# Patient Record
Sex: Female | Born: 1995 | Race: Black or African American | Hispanic: No | Marital: Single | Smoking: Former smoker
Health system: Southern US, Community
[De-identification: ages and names within clinical notes are randomized; demographics above are authoritative.]

## PROBLEM LIST (undated history)

## (undated) HISTORY — PX: TRACHEOSTOMY: SUR1362

---
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
## 2017-03-06 ENCOUNTER — Ambulatory Visit (INDEPENDENT_AMBULATORY_CARE_PROVIDER_SITE_OTHER): Payer: Self-pay

## 2017-03-06 ENCOUNTER — Encounter: Payer: Self-pay | Admitting: General Practice

## 2017-03-06 DIAGNOSIS — Z3201 Encounter for pregnancy test, result positive: Secondary | ICD-10-CM

## 2017-03-06 LAB — POCT PREGNANCY, URINE: PREG TEST UR: POSITIVE — AB

## 2017-03-06 NOTE — Progress Notes (Signed)
Patient presented to office today for a pregnancy test. Test does confirmed she is pregnant. Patient thinks her last period was in March but she is unsure.  Patient has been given a letter of verification to apply for assistance. She was also advised to start prenatal care and start OTC prenatal vitamins. Patient was advised not to take any medications.

## 2017-04-05 ENCOUNTER — Ambulatory Visit (INDEPENDENT_AMBULATORY_CARE_PROVIDER_SITE_OTHER): Payer: Medicaid Other | Admitting: Obstetrics and Gynecology

## 2017-04-05 ENCOUNTER — Encounter: Payer: Self-pay | Admitting: Obstetrics and Gynecology

## 2017-04-05 ENCOUNTER — Other Ambulatory Visit (HOSPITAL_COMMUNITY)
Admission: RE | Admit: 2017-04-05 | Discharge: 2017-04-05 | Disposition: A | Discharge status: Home / Self Care | Payer: Medicaid Other | Source: Ambulatory Visit | Attending: Obstetrics and Gynecology | Admitting: Obstetrics and Gynecology

## 2017-04-05 VITALS — BP 123/82 | HR 93 | Ht 64.0 in | Wt 255.1 lb

## 2017-04-05 DIAGNOSIS — Z3401 Encounter for supervision of normal first pregnancy, first trimester: Secondary | ICD-10-CM

## 2017-04-05 DIAGNOSIS — Z3400 Encounter for supervision of normal first pregnancy, unspecified trimester: Secondary | ICD-10-CM | POA: Diagnosis not present

## 2017-04-05 DIAGNOSIS — Z124 Encounter for screening for malignant neoplasm of cervix: Secondary | ICD-10-CM | POA: Diagnosis not present

## 2017-04-05 DIAGNOSIS — O99211 Obesity complicating pregnancy, first trimester: Secondary | ICD-10-CM | POA: Diagnosis not present

## 2017-04-05 DIAGNOSIS — E669 Obesity, unspecified: Secondary | ICD-10-CM | POA: Diagnosis not present

## 2017-04-05 DIAGNOSIS — Z3687 Encounter for antenatal screening for uncertain dates: Secondary | ICD-10-CM | POA: Diagnosis present

## 2017-04-05 DIAGNOSIS — Z113 Encounter for screening for infections with a predominantly sexual mode of transmission: Secondary | ICD-10-CM | POA: Diagnosis not present

## 2017-04-05 DIAGNOSIS — O99210 Obesity complicating pregnancy, unspecified trimester: Secondary | ICD-10-CM | POA: Insufficient documentation

## 2017-04-05 LAB — POCT URINALYSIS DIP (DEVICE)
Bilirubin Urine: NEGATIVE
GLUCOSE, UA: NEGATIVE mg/dL
HGB URINE DIPSTICK: NEGATIVE
KETONES UR: NEGATIVE mg/dL
Nitrite: NEGATIVE
Protein, ur: NEGATIVE mg/dL
SPECIFIC GRAVITY, URINE: 1.015 (ref 1.005–1.030)
UROBILINOGEN UA: 0.2 mg/dL (ref 0.0–1.0)
pH: 7.5 (ref 5.0–8.0)

## 2017-04-05 MED ORDER — FOLIC ACID 1 MG PO TABS: 1.0000 mg | ORAL_TABLET | Freq: Every day | ORAL | 1 refills | Status: DC

## 2017-04-05 NOTE — Progress Notes (Addendum)
   PRENATAL VISIT NOTE  Subjective:  Audrey Hansen is a 21 y.o. G1P0 at 1920w1d being seen today for ongoing prenatal care.  She is currently monitored for the following issues for this low-risk pregnancy and has Supervision of normal first pregnancy, antepartum on her problem list.  Patient reports nausea.  Contractions: Not present. Vag. Bleeding: None.  Movement: Absent. Denies leaking of fluid.   Patient denies any pertinent medical problems. No history of Hypertension or DM.  No family history of DM or HTN.  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 No abdominal surgery.   The following portions of the patient's history were reviewed and updated as appropriate: allergies, current medications, past family history, past medical history, past social history, past surgical history and problem list. Problem list updated.  Objective:   Vitals:   04/05/17 1301 04/05/17 1302  BP: 123/82   Pulse: 93   Weight: 255 lb 1.6 oz (115.7 kg)   Height:  5\' 4"  (1.626 m)    Fetal Status:     Movement: Absent     General:  Alert, oriented and cooperative. Patient is in no acute distress.  Skin: Skin is warm and dry. No rash noted.   Cardiovascular: Normal heart rate noted, RRR no murmur  Respiratory: Normal respiratory effort, no problems with respiration noted, CTAB  Abdomen: Soft, gravid, appropriate for gestational age. Pain/Pressure: Present     Pelvic:   Normal health vaginal mucosa. No discharge. Cervical os closed  Extremities: Normal range of motion.     Mental Status: Normal mood and affect. Normal behavior. Normal judgment and thought content.   Assessment and Plan:  Pregnancy: G1P0 at 7020w1d  1. Supervision of normal first pregnancy, antepartum -unsure LMP and morbid obesity. US ordered for dates and to verify viability. Did not attempt FHTs today. -Given morbid obesity checking HgBA1c today -prenatal labs -pap collected -GC/CT off of PAP. -patient reporting nausea with  pre-natal - folic acid called to pharmacy. Follow up in 4 wks.  Preterm labor symptoms and general obstetric precautions including but not limited to vaginal bleeding, contractions, leaking of fluid and fetal movement were reviewed in detail with the patient. Please refer to After Visit Summary for other counseling recommendations.  No Follow-up on file.   Ernestina PennaNicholas Schenk, MD

## 2017-04-05 NOTE — Patient Instructions (Signed)
First Trimester of Pregnancy The first trimester of pregnancy is from week 1 until the end of week 13 (months 1 through 3). During this time, your baby will begin to develop inside you. At 6-8 weeks, the eyes and face are formed, and the heartbeat can be seen on ultrasound. At the end of 12 weeks, all the baby's organs are formed. Prenatal care is all the medical care you receive before the birth of your baby. Make sure you get good prenatal care and follow all of your doctor's instructions. Follow these instructions at home: Medicines  Take over-the-counter and prescription medicines only as told by your doctor. Some medicines are safe and some medicines are not safe during pregnancy.  Take a prenatal vitamin that contains at least 600 micrograms (mcg) of folic acid.  If you have trouble pooping (constipation), take medicine that will make your stool soft (stool softener) if your doctor approves. Eating and drinking  Eat regular, healthy meals.  Your doctor will tell you the amount of weight gain that is right for you.  Avoid raw meat and uncooked cheese.  If you feel sick to your stomach (nauseous) or throw up (vomit): ? Eat 4 or 5 small meals a day instead of 3 large meals. ? Try eating a few soda crackers. ? Drink liquids between meals instead of during meals.  To prevent constipation: ? Eat foods that are high in fiber, like fresh fruits and vegetables, whole grains, and beans. ? Drink enough fluids to keep your pee (urine) clear or pale yellow. Activity  Exercise only as told by your doctor. Stop exercising if you have cramps or pain in your lower belly (abdomen) or low back.  Do not exercise if it is too hot, too humid, or if you are in a place of great height (high altitude).  Try to avoid standing for long periods of time. Move your legs often if you must stand in one place for a long time.  Avoid heavy lifting.  Wear low-heeled shoes. Sit and stand up straight.  You  can have sex unless your doctor tells you not to. Relieving pain and discomfort  Wear a good support bra if your breasts are sore.  Take warm water baths (sitz baths) to soothe pain or discomfort caused by hemorrhoids. Use hemorrhoid cream if your doctor says it is okay.  Rest with your legs raised if you have leg cramps or low back pain.  If you have puffy, bulging veins (varicose veins) in your legs: ? Wear support hose or compression stockings as told by your doctor. ? Raise (elevate) your feet for 15 minutes, 3-4 times a day. ? Limit salt in your food. Prenatal care  Schedule your prenatal visits by the twelfth week of pregnancy.  Write down your questions. Take them to your prenatal visits.  Keep all your prenatal visits as told by your doctor. This is important. Safety  Wear your seat belt at all times when driving.  Make a list of emergency phone numbers. The list should include numbers for family, friends, the hospital, and police and fire departments. General instructions  Ask your doctor for a referral to a local prenatal class. Begin classes no later than at the start of month 6 of your pregnancy.  Ask for help if you need counseling or if you need help with nutrition. Your doctor can give you advice or tell you where to go for help.  Do not use hot tubs, steam rooms, or   saunas.  Do not douche or use tampons or scented sanitary pads.  Do not cross your legs for long periods of time.  Avoid all herbs and alcohol. Avoid drugs that are not approved by your doctor.  Do not use any tobacco products, including cigarettes, chewing tobacco, and electronic cigarettes. If you need help quitting, ask your doctor. You may get counseling or other support to help you quit.  Avoid cat litter boxes and soil used by cats. These carry germs that can cause birth defects in the baby and can cause a loss of your baby (miscarriage) or stillbirth.  Visit your dentist. At home, brush  your teeth with a soft toothbrush. Be gentle when you floss. Contact a doctor if:  You are dizzy.  You have mild cramps or pressure in your lower belly.  You have a nagging pain in your belly area.  You continue to feel sick to your stomach, you throw up, or you have watery poop (diarrhea).  You have a bad smelling fluid coming from your vagina.  You have pain when you pee (urinate).  You have increased puffiness (swelling) in your face, hands, legs, or ankles. Get help right away if:  You have a fever.  You are leaking fluid from your vagina.  You have spotting or bleeding from your vagina.  You have very bad belly cramping or pain.  You gain or lose weight rapidly.  You throw up blood. It may look like coffee grounds.  You are around people who have German measles, fifth disease, or chickenpox.  You have a very bad headache.  You have shortness of breath.  You have any kind of trauma, such as from a fall or a car accident. Summary  The first trimester of pregnancy is from week 1 until the end of week 13 (months 1 through 3).  To take care of yourself and your unborn baby, you will need to eat healthy meals, take medicines only if your doctor tells you to do so, and do activities that are safe for you and your baby.  Keep all follow-up visits as told by your doctor. This is important as your doctor will have to ensure that your baby is healthy and growing well. This information is not intended to replace advice given to you by your health care provider. Make sure you discuss any questions you have with your health care provider. Document Released: 04/04/2008 Document Revised: 10/25/2016 Document Reviewed: 10/25/2016 Elsevier Interactive Patient Education  2017 Elsevier Inc.  

## 2017-04-10 LAB — CYTOLOGY - PAP
ADEQUACY: ABSENT
Chlamydia: NEGATIVE
DIAGNOSIS: NEGATIVE
Neisseria Gonorrhea: NEGATIVE

## 2017-04-12 ENCOUNTER — Ambulatory Visit (HOSPITAL_COMMUNITY)
Admission: RE | Admit: 2017-04-12 | Discharge: 2017-04-12 | Disposition: A | Discharge status: Home / Self Care | Payer: Medicaid Other | Source: Ambulatory Visit | Attending: Obstetrics and Gynecology | Admitting: Obstetrics and Gynecology

## 2017-04-12 DIAGNOSIS — Z3A11 11 weeks gestation of pregnancy: Secondary | ICD-10-CM | POA: Diagnosis not present

## 2017-04-12 DIAGNOSIS — Z3687 Encounter for antenatal screening for uncertain dates: Secondary | ICD-10-CM

## 2017-04-12 DIAGNOSIS — Z3400 Encounter for supervision of normal first pregnancy, unspecified trimester: Secondary | ICD-10-CM

## 2017-04-12 LAB — OBSTETRIC PANEL, INCLUDING HIV
Antibody Screen: NEGATIVE
BASOS ABS: 0 10*3/uL (ref 0.0–0.2)
Basos: 0 %
EOS (ABSOLUTE): 0.1 10*3/uL (ref 0.0–0.4)
Eos: 1 %
HEP B S AG: NEGATIVE
HIV Screen 4th Generation wRfx: NONREACTIVE
Hematocrit: 36.3 % (ref 34.0–46.6)
Hemoglobin: 12 g/dL (ref 11.1–15.9)
IMMATURE GRANS (ABS): 0 10*3/uL (ref 0.0–0.1)
IMMATURE GRANULOCYTES: 0 %
LYMPHS: 35 %
Lymphocytes Absolute: 2.1 10*3/uL (ref 0.7–3.1)
MCH: 25.6 pg — ABNORMAL LOW (ref 26.6–33.0)
MCHC: 33.1 g/dL (ref 31.5–35.7)
MCV: 77 fL — AB (ref 79–97)
MONOCYTES: 6 %
Monocytes Absolute: 0.3 10*3/uL (ref 0.1–0.9)
NEUTROS ABS: 3.4 10*3/uL (ref 1.4–7.0)
NEUTROS PCT: 58 %
PLATELETS: 307 10*3/uL (ref 150–379)
RBC: 4.69 x10E6/uL (ref 3.77–5.28)
RDW: 14.8 % (ref 12.3–15.4)
RPR: NONREACTIVE
Rh Factor: POSITIVE
WBC: 5.9 10*3/uL (ref 3.4–10.8)

## 2017-04-12 LAB — HEMOGLOBINOPATHY EVALUATION
Ferritin: 62 ng/mL (ref 15–150)
HGB C: 0 %
HGB F QUANT: 0 % (ref 0.0–2.0)
HGB S: 0 %
HGB SOLUBILITY: NEGATIVE
Hgb A2 Quant: 2.5 % (ref 1.8–3.2)
Hgb A: 97.5 % (ref 96.4–98.8)
Hgb Variant: 0 %

## 2017-04-12 LAB — HEMOGLOBIN A1C
ESTIMATED AVERAGE GLUCOSE: 114 mg/dL
Hgb A1c MFr Bld: 5.6 % (ref 4.8–5.6)

## 2017-04-12 LAB — ALPHA-THALASSEMIA

## 2017-04-13 LAB — URINE CULTURE, OB REFLEX

## 2017-04-13 LAB — CULTURE, OB URINE

## 2017-04-14 ENCOUNTER — Telehealth: Payer: Self-pay | Admitting: *Deleted

## 2017-04-14 ENCOUNTER — Encounter: Payer: Self-pay | Admitting: *Deleted

## 2017-04-14 DIAGNOSIS — O2340 Unspecified infection of urinary tract in pregnancy, unspecified trimester: Secondary | ICD-10-CM

## 2017-04-14 DIAGNOSIS — B951 Streptococcus, group B, as the cause of diseases classified elsewhere: Secondary | ICD-10-CM | POA: Insufficient documentation

## 2017-04-14 MED ORDER — CEPHALEXIN 500 MG PO CAPS: 500.0000 mg | ORAL_CAPSULE | Freq: Three times a day (TID) | ORAL | 0 refills | Status: DC

## 2017-04-14 NOTE — Telephone Encounter (Addendum)
-----   Message from Lorne SkeensNicholas Michael Schenk, MD sent at 04/13/2017  4:14 PM EDT ----- Please call in Keflex 500mg  TID for 7 days for GBS in urine and notify patient. Thanks Joette CatchingNick  Called pt and left message stating that a prescription has been ordered by the doctor and sent to her pharmacy. Please obtain the medication and take according to directions.  She may call the office next week if she has questions.

## 2017-04-17 ENCOUNTER — Other Ambulatory Visit: Payer: Self-pay | Admitting: Obstetrics and Gynecology

## 2017-05-02 ENCOUNTER — Ambulatory Visit (INDEPENDENT_AMBULATORY_CARE_PROVIDER_SITE_OTHER): Payer: Medicaid Other | Admitting: Advanced Practice Midwife

## 2017-05-02 VITALS — BP 123/78 | HR 92 | Wt 247.7 lb

## 2017-05-02 DIAGNOSIS — Z3402 Encounter for supervision of normal first pregnancy, second trimester: Secondary | ICD-10-CM

## 2017-05-02 DIAGNOSIS — O2342 Unspecified infection of urinary tract in pregnancy, second trimester: Secondary | ICD-10-CM

## 2017-05-02 DIAGNOSIS — B951 Streptococcus, group B, as the cause of diseases classified elsewhere: Secondary | ICD-10-CM | POA: Diagnosis not present

## 2017-05-02 DIAGNOSIS — O99212 Obesity complicating pregnancy, second trimester: Secondary | ICD-10-CM

## 2017-05-02 DIAGNOSIS — Z6836 Body mass index (BMI) 36.0-36.9, adult: Secondary | ICD-10-CM

## 2017-05-02 DIAGNOSIS — Z3A19 19 weeks gestation of pregnancy: Secondary | ICD-10-CM

## 2017-05-02 DIAGNOSIS — Z363 Encounter for antenatal screening for malformations: Secondary | ICD-10-CM

## 2017-05-02 DIAGNOSIS — Z3400 Encounter for supervision of normal first pregnancy, unspecified trimester: Secondary | ICD-10-CM

## 2017-05-02 NOTE — Patient Instructions (Addendum)
Second Trimester of Pregnancy The second trimester is from week 14 through week 27 (months 4 through 6). The second trimester is often a time when you feel your best. Your body has adjusted to being pregnant, and you begin to feel better physically. Usually, morning sickness has lessened or quit completely, you may have more energy, and you may have an increase in appetite. The second trimester is also a time when the fetus is growing rapidly. At the end of the sixth month, the fetus is about 9 inches long and weighs about 1 pounds. You will likely begin to feel the baby move (quickening) between 16 and 20 weeks of pregnancy. Body changes during your second trimester Your body continues to go through many changes during your second trimester. The changes vary from woman to woman.  Your weight will continue to increase. You will notice your lower abdomen bulging out.  You may begin to get stretch marks on your hips, abdomen, and breasts.  You may develop headaches that can be relieved by medicines. The medicines should be approved by your health care provider.  You may urinate more often because the fetus is pressing on your bladder.  You may develop or continue to have heartburn as a result of your pregnancy.  You may develop constipation because certain hormones are causing the muscles that push waste through your intestines to slow down.  You may develop hemorrhoids or swollen, bulging veins (varicose veins).  You may have back pain. This is caused by:  Weight gain.  Pregnancy hormones that are relaxing the joints in your pelvis.  A shift in weight and the muscles that support your balance.  Your breasts will continue to grow and they will continue to become tender.  Your gums may bleed and may be sensitive to brushing and flossing.  Dark spots or blotches (chloasma, mask of pregnancy) may develop on your face. This will likely fade after the baby is born.  A dark line from your  belly button to the pubic area (linea nigra) may appear. This will likely fade after the baby is born.  You may have changes in your hair. These can include thickening of your hair, rapid growth, and changes in texture. Some women also have hair loss during or after pregnancy, or hair that feels dry or thin. Your hair will most likely return to normal after your baby is born. What to expect at prenatal visits During a routine prenatal visit:  You will be weighed to make sure you and the fetus are growing normally.  Your blood pressure will be taken.  Your abdomen will be measured to track your baby's growth.  The fetal heartbeat will be listened to.  Any test results from the previous visit will be discussed. Your health care provider may ask you:  How you are feeling.  If you are feeling the baby move.  If you have had any abnormal symptoms, such as leaking fluid, bleeding, severe headaches, or abdominal cramping.  If you are using any tobacco products, including cigarettes, chewing tobacco, and electronic cigarettes.  If you have any questions. Other tests that may be performed during your second trimester include:  Blood tests that check for:  Low iron levels (anemia).  High blood sugar that affects pregnant women (gestational diabetes) between 24 and 28 weeks.  Rh antibodies. This is to check for a protein on red blood cells (Rh factor).  Urine tests to check for infections, diabetes, or protein in the   urine.  An ultrasound to confirm the proper growth and development of the baby.  An amniocentesis to check for possible genetic problems.  Fetal screens for spina bifida and Down syndrome.  HIV (human immunodeficiency virus) testing. Routine prenatal testing includes screening for HIV, unless you choose not to have this test. Follow these instructions at home: Medicines   Follow your health care provider's instructions regarding medicine use. Specific medicines may  be either safe or unsafe to take during pregnancy.  Take a prenatal vitamin that contains at least 600 micrograms (mcg) of folic acid.  If you develop constipation, try taking a stool softener if your health care provider approves. Eating and drinking   Eat a balanced diet that includes fresh fruits and vegetables, whole grains, good sources of protein such as meat, eggs, or tofu, and low-fat dairy. Your health care provider will help you determine the amount of weight gain that is right for you.  Avoid raw meat and uncooked cheese. These carry germs that can cause birth defects in the baby.  If you have low calcium intake from food, talk to your health care provider about whether you should take a daily calcium supplement.  Limit foods that are high in fat and processed sugars, such as fried and sweet foods.  To prevent constipation:  Drink enough fluid to keep your urine clear or pale yellow.  Eat foods that are high in fiber, such as fresh fruits and vegetables, whole grains, and beans. Activity   Exercise only as directed by your health care provider. Most women can continue their usual exercise routine during pregnancy. Try to exercise for 30 minutes at least 5 days a week. Stop exercising if you experience uterine contractions.  Avoid heavy lifting, wear low heel shoes, and practice good posture.  A sexual relationship may be continued unless your health care provider directs you otherwise. Relieving pain and discomfort   Wear a good support bra to prevent discomfort from breast tenderness.  Take warm sitz baths to soothe any pain or discomfort caused by hemorrhoids. Use hemorrhoid cream if your health care provider approves.  Rest with your legs elevated if you have leg cramps or low back pain.  If you develop varicose veins, wear support hose. Elevate your feet for 15 minutes, 3-4 times a day. Limit salt in your diet. Prenatal Care   Write down your questions. Take them  to your prenatal visits.  Keep all your prenatal visits as told by your health care provider. This is important. Safety   Wear your seat belt at all times when driving.  Make a list of emergency phone numbers, including numbers for family, friends, the hospital, and police and fire departments. General instructions   Ask your health care provider for a referral to a local prenatal education class. Begin classes no later than the beginning of month 6 of your pregnancy.  Ask for help if you have counseling or nutritional needs during pregnancy. Your health care provider can offer advice or refer you to specialists for help with various needs.  Do not use hot tubs, steam rooms, or saunas.  Do not douche or use tampons or scented sanitary pads.  Do not cross your legs for long periods of time.  Avoid cat litter boxes and soil used by cats. These carry germs that can cause birth defects in the baby and possibly loss of the fetus by miscarriage or stillbirth.  Avoid all smoking, herbs, alcohol, and unprescribed drugs. Chemicals in   these products can affect the formation and growth of the baby.  Do not use any products that contain nicotine or tobacco, such as cigarettes and e-cigarettes. If you need help quitting, ask your health care provider.  Visit your dentist if you have not gone yet during your pregnancy. Use a soft toothbrush to brush your teeth and be gentle when you floss. Contact a health care provider if:  You have dizziness.  You have mild pelvic cramps, pelvic pressure, or nagging pain in the abdominal area.  You have persistent nausea, vomiting, or diarrhea.  You have a bad smelling vaginal discharge.  You have pain when you urinate. Get help right away if:  You have a fever.  You are leaking fluid from your vagina.  You have spotting or bleeding from your vagina.  You have severe abdominal cramping or pain.  You have rapid weight gain or weight loss.  You  have shortness of breath with chest pain.  You notice sudden or extreme swelling of your face, hands, ankles, feet, or legs.  You have not felt your baby move in over an hour.  You have severe headaches that do not go away when you take medicine.  You have vision changes. Summary  The second trimester is from week 14 through week 27 (months 4 through 6). It is also a time when the fetus is growing rapidly.  Your body goes through many changes during pregnancy. The changes vary from woman to woman.  Avoid all smoking, herbs, alcohol, and unprescribed drugs. These chemicals affect the formation and growth your baby.  Do not use any tobacco products, such as cigarettes, chewing tobacco, and e-cigarettes. If you need help quitting, ask your health care provider.  Contact your health care provider if you have any questions. Keep all prenatal visits as told by your health care provider. This is important. This information is not intended to replace advice given to you by your health care provider. Make sure you discuss any questions you have with your health care provider. Document Released: 10/11/2001 Document Revised: 03/24/2016 Document Reviewed: 12/18/2012 Elsevier Interactive Patient Education  2017 Elsevier Inc.   AREA PEDIATRIC/FAMILY PRACTICE PHYSICIANS  [facility] [address], [address] [place], [place]  [address] Phone - [phone]   Fax - [phone]  [facility] [address] [place], [place] [address] Phone - [phone]   Fax - [phone]  [facility] [address] [place], [place]  [address] Phone - [phone]   Fax - [phone]  [facility] [address] [place], [place]  [address] Phone - [phone]   Fax - [phone]  [facility] [address] [place], [place]  [address] Phone - [phone]   Fax - [phone]  [facility] [address] [place], [place]  [address] Phone - [phone]   Fax - [phone]  [facility] [address] [place], [place]  [address] Phone - [phone]   Fax - [phone]  [facility] [address] [place], [place]  [address] Phone - [phone]   Fax - [phone]  [facility] [address] [place], [place]  [address] Phone - [phone]   Fax - [phone] [facility] [address] [place], [place]  [address] Phone - [phone]   Fax - [phone]  [facility] [address] [place], [place]  [address] Phone - [phone]   Fax - [phone]  [facility] [address] [place], [place]  [address] Phone - [phone]   Fax - [phone]  [facility] [address] [place], [place]  [address] Phone - [phone]   Fax - [phone]  [facility] GOSR[facility] [address] [place], [place]  [address] Phone - [phone]  [facility] [address] [place], [place]  [address] Phone - [phone]   Fax - [phone]  [facility] [address] [place], [place]  [address] Phone - [phone]   Fax - [phone]  [facility] [address] [place], [place]  [address] Phone - [phone]   Fax - [phone]  [facility] [address] [place], [place]  [address] Phone - [phone]   Fax - [phone]   [facility] [address] [place], [place]  [address] Phone - [phone]   Fax - [phone]  [facility] [address] [place], [place]  [address] Phone - [phone]   Fax - [phone]  [facility] RUB[facility] [address] [place], [place]  [address] Phone - [phone]   Fax -  [phone]  [facility] [address] [place], [place]  [address] Phone - [phone]   Fax - [phone]  [facility] [address] [place], [place]  [address] Phone - [phone]   Fax - [phone] [facility] [address] [place], [place]  [address] Phone - [phone]   Fax - [phone]  [facility] [address] [place], [place]  [address] Phone - [phone]   Fax - [phone]  [facility] [address] [place], [place]  [address] Phone - [phone]   Fax - [phone]  [facility] Charlene Flemming MD [address] [place]  [address] Phone [phone]  Fax [phone]   

## 2017-05-02 NOTE — Progress Notes (Signed)
   PRENATAL VISIT NOTE  Subjective:  Audrey Hansen is a 21 y.o. G1P0 at 3625w0d being seen today for ongoing prenatal care.  She is currently monitored for the following issues for this low-risk pregnancy and has Supervision of normal first pregnancy, antepartum; Obesity in pregnancy; and GBS (group B streptococcus) UTI complicating pregnancy on her problem list.  Patient reports no complaints.  Contractions: Not present. Vag. Bleeding: None.  Movement: Absent. Denies leaking of fluid. Denies new onset of headaches, visual changes, RUQ abdominal pain, and vaginal bleeding/discharge.  She has had some lower extremity swelling  The following portions of the patient's history were reviewed and updated as appropriate: allergies, current medications, past family history, past medical history, past social history, past surgical history and problem list. Problem list updated.  Objective:   Vitals:   05/02/17 1058  BP: 123/78  Pulse: 92  Weight: 247 lb 11.2 oz (112.4 kg)    Fetal Status: Fetal Heart Rate (bpm): 154   Movement: Absent     General:  Alert, oriented and cooperative. Patient is in no acute distress.  Skin: Skin is warm and dry. No rash noted.   Cardiovascular: Normal heart rate noted  Respiratory: Normal respiratory effort, no problems with respiration noted  Abdomen: Soft, gravid, appropriate for gestational age. Pain/Pressure: Absent     Pelvic:  Cervical exam deferred        Extremities: Normal range of motion.  Edema: None  Mental Status: Normal mood and affect. Normal behavior. Normal judgment and thought content.   Assessment and Plan:  Pregnancy: G1P0 at 2125w0d  1. Supervision of normal first pregnancy, antepartum - US MFM OB DETAIL +14 WK; Future  2. BMI 36.0-36.9,adult  3. Screening, antenatal, for malformation by ultrasound - US MFM OB DETAIL +14 WK; Future  Preterm labor symptoms and general obstetric precautions including but not limited to vaginal bleeding,  contractions, leaking of fluid and fetal movement were reviewed in detail with the patient. Please refer to After Visit Summary for other counseling recommendations.  Return in about 4 weeks (around 05/30/2017) for ROB.   Chrissie NoaMatthew Oluwatosin Bracy, Student-PA

## 2017-05-30 ENCOUNTER — Ambulatory Visit (INDEPENDENT_AMBULATORY_CARE_PROVIDER_SITE_OTHER): Payer: Medicaid Other | Admitting: Certified Nurse Midwife

## 2017-05-30 VITALS — BP 126/79 | HR 93 | Wt 249.9 lb

## 2017-05-30 DIAGNOSIS — Z3400 Encounter for supervision of normal first pregnancy, unspecified trimester: Secondary | ICD-10-CM

## 2017-05-30 DIAGNOSIS — O99212 Obesity complicating pregnancy, second trimester: Secondary | ICD-10-CM

## 2017-05-30 DIAGNOSIS — B951 Streptococcus, group B, as the cause of diseases classified elsewhere: Secondary | ICD-10-CM

## 2017-05-30 DIAGNOSIS — E669 Obesity, unspecified: Secondary | ICD-10-CM

## 2017-05-30 DIAGNOSIS — O99210 Obesity complicating pregnancy, unspecified trimester: Secondary | ICD-10-CM

## 2017-05-30 DIAGNOSIS — O2342 Unspecified infection of urinary tract in pregnancy, second trimester: Secondary | ICD-10-CM

## 2017-05-30 DIAGNOSIS — Z3402 Encounter for supervision of normal first pregnancy, second trimester: Secondary | ICD-10-CM

## 2017-05-30 NOTE — Progress Notes (Signed)
   PRENATAL VISIT NOTE  Subjective:  Audrey Hansen is a 21 y.o. G1P0 at 7671w0d being seen today for ongoing prenatal care.  She is currently monitored for the following issues for this low-risk pregnancy and has Supervision of normal first pregnancy, antepartum; Obesity in pregnancy; and GBS (group B streptococcus) UTI complicating pregnancy on her problem list.  Patient reports no complaints.  Contractions: Not present. Vag. Bleeding: None.  Movement: Present. Denies leaking of fluid.   The following portions of the patient's history were reviewed and updated as appropriate: allergies, current medications, past family history, past medical history, past social history, past surgical history and problem list. Problem list updated.  Objective:   Vitals:   05/30/17 1107  BP: 126/79  Pulse: 93  Weight: 249 lb 14.4 oz (113.4 kg)    Fetal Status: Fetal Heart Rate (bpm): 152 Fundal Height: 18 cm Movement: Present     General:  Alert, oriented and cooperative. Patient is in no acute distress.  Skin: Skin is warm and dry. No rash noted.   Cardiovascular: Normal heart rate noted  Respiratory: Normal respiratory effort, no problems with respiration noted  Abdomen: Soft, gravid, appropriate for gestational age.  Pain/Pressure: Present     Pelvic: Cervical exam deferred        Extremities: Normal range of motion.  Edema: None  Mental Status:  Normal mood and affect. Normal behavior. Normal judgment and thought content.   Assessment and Plan:  Pregnancy: G1P0 at 6071w0d  1. Supervision of normal first pregnancy, antepartum - US OB Comp +14 weeks scheduled on 06/07/17  2. Group B Streptococcus urinary tract infection affecting pregnancy in second trimester - Had Keflex - needs TOC next visit (pt already left)  3. Obesity in pregnancy  Preterm labor symptoms and general obstetric precautions including but not limited to vaginal bleeding, contractions, leaking of fluid and fetal movement  were reviewed in detail with the patient. Please refer to After Visit Summary for other counseling recommendations.  Return in about 4 weeks (around 06/27/2017).   Donette LarryMelanie Bhambri, CNM

## 2017-05-30 NOTE — Patient Instructions (Signed)
AREA PEDIATRIC/FAMILY PRACTICE PHYSICIANS  [facility] [address] [place], [place] [address] Phone - [phone]   Fax - [phone]  [facility] [address] [place], [place]  [address] Phone - [phone]   Fax - [phone]  [facility] [address] [place], [place]  [address] Phone - [phone]   Fax - [phone]  [facility] [address] [place], [place]  [address] Phone - [phone]   Fax - [phone]   [facility] [address] [place], [place]  [address] Phone - [phone]   Fax - [phone]  [facility] [address] [place], [place]  [address] Phone - [phone]   Fax - [phone]  [facility] [address] [place], [place]  [address] Phone - [phone]   Fax - [phone]  [facility] [address] [place], [place]  [address] Phone - [phone]   Fax - [phone]  [facility] [address] [place], [place]  [address] Phone - [phone]   Fax - [phone] [facility] [address] [place], [place]  [address] Phone - [phone]   Fax - [phone]  [facility] [address] [place], [place]  [address] Phone - [phone]   Fax - [phone]  [facility] [address] [place], [place]  [address] Phone - [phone]   Fax - [phone]  [facility] [address] [place], [place]  [address] Phone - [phone]   Fax - [phone]  [facility] [address] [place], [place]  [address] Phone - [phone]  [facility] [address] [place], [place]  [address] Phone - [phone]   Fax - [phone]  [facility] [address] [place], [place]  [address] Phone - [phone]   Fax - [phone]  [facility] [address] [place], [place]  [address] Phone - [phone]   Fax - [phone]  [facility] [address] [place], [place]  [address] Phone - [phone]   Fax - [phone]   [facility] [address] [place], [place]  [address] Phone - [phone]   Fax - [phone]  [facility] [address] [place], [place]  [address] Phone - [phone]   Fax - [phone]  [facility] [address] [place], [place]  [address] Phone - [phone]   Fax - [phone]  [facility] [address] [place], [place]  [address] Phone - [phone]   Fax - [phone]  [facility] [address] [place], [place]  [address] Phone - [phone]   Fax - [phone] [facility] [address] [place], [place]  [address] Phone - [phone]   Fax - [phone]  [facility] [address] [place],   [address] Phone - [phone]   Fax - [phone]  [facility] [address] [place],   [address] Phone - [phone]   Fax - [phone]   

## 2017-05-30 NOTE — Progress Notes (Signed)
Patient does not want quad screen

## 2017-06-07 ENCOUNTER — Encounter (HOSPITAL_COMMUNITY): Payer: Self-pay

## 2017-06-07 ENCOUNTER — Ambulatory Visit (HOSPITAL_COMMUNITY)
Admission: RE | Admit: 2017-06-07 | Discharge: 2017-06-07 | Disposition: A | Discharge status: Home / Self Care | Payer: Medicaid Other | Source: Ambulatory Visit | Attending: Advanced Practice Midwife | Admitting: Advanced Practice Midwife

## 2017-06-07 ENCOUNTER — Other Ambulatory Visit (HOSPITAL_COMMUNITY): Payer: Medicaid Other

## 2017-06-07 DIAGNOSIS — O99212 Obesity complicating pregnancy, second trimester: Secondary | ICD-10-CM | POA: Insufficient documentation

## 2017-06-07 DIAGNOSIS — Z363 Encounter for antenatal screening for malformations: Secondary | ICD-10-CM | POA: Insufficient documentation

## 2017-06-07 DIAGNOSIS — Z3A19 19 weeks gestation of pregnancy: Secondary | ICD-10-CM | POA: Diagnosis not present

## 2017-06-07 DIAGNOSIS — Z6836 Body mass index (BMI) 36.0-36.9, adult: Secondary | ICD-10-CM | POA: Diagnosis not present

## 2017-06-07 DIAGNOSIS — Z3400 Encounter for supervision of normal first pregnancy, unspecified trimester: Secondary | ICD-10-CM

## 2017-06-27 ENCOUNTER — Ambulatory Visit (INDEPENDENT_AMBULATORY_CARE_PROVIDER_SITE_OTHER): Payer: Medicaid Other | Admitting: Medical

## 2017-06-27 VITALS — BP 122/74 | HR 88 | Wt 254.0 lb

## 2017-06-27 DIAGNOSIS — Z3400 Encounter for supervision of normal first pregnancy, unspecified trimester: Secondary | ICD-10-CM

## 2017-06-27 DIAGNOSIS — O2342 Unspecified infection of urinary tract in pregnancy, second trimester: Secondary | ICD-10-CM

## 2017-06-27 DIAGNOSIS — B951 Streptococcus, group B, as the cause of diseases classified elsewhere: Secondary | ICD-10-CM

## 2017-06-27 MED ORDER — PRENATAL VITAMINS 0.8 MG PO TABS: 1.0000 | ORAL_TABLET | Freq: Every day | ORAL | 12 refills | Status: AC

## 2017-06-27 NOTE — Progress Notes (Signed)
   PRENATAL VISIT NOTE  Subjective:  Audrey Hansen is a 21 y.o. G1P0 at [redacted]w[redacted]d being seen today for ongoing prenatal care.  She is currently monitored for the following issues for this low-risk pregnancy and has Supervision of normal first pregnancy, antepartum; Obesity in pregnancy; and GBS (group B streptococcus) UTI complicating pregnancy on her problem list.  Patient reports no complaints.  Contractions: Not present. Vag. Bleeding: None.  Movement: Present. Denies leaking of fluid.   The following portions of the patient's history were reviewed and updated as appropriate: allergies, current medications, past family history, past medical history, past social history, past surgical history and problem list. Problem list updated.  Objective:   Vitals:   06/27/17 1000  BP: 122/74  Pulse: 88  Weight: 254 lb (115.2 kg)    Fetal Status: Fetal Heart Rate (bpm): 148 Fundal Height: 23 cm Movement: Present     General:  Alert, oriented and cooperative. Patient is in no acute distress.  Skin: Skin is warm and dry. No rash noted.   Cardiovascular: Normal heart rate noted  Respiratory: Normal respiratory effort, no problems with respiration noted  Abdomen: Soft, gravid, appropriate for gestational age.  Pain/Pressure: Absent     Pelvic: Cervical exam deferred        Extremities: Normal range of motion.  Edema: None  Mental Status:  Normal mood and affect. Normal behavior. Normal judgment and thought content.   Assessment and Plan:  Pregnancy: G1P0 at [redacted]w[redacted]d  1. Supervision of normal first pregnancy, antepartum - Prenatal Multivit-Min-Fe-FA (PRENATAL VITAMINS) 0.8 MG tablet; Take 1 tablet by mouth daily.  Dispense: 30 tablet; Refill: 12  2. Group B Streptococcus urinary tract infection affecting pregnancy in second trimester - Treat in labor  Patient has moved to [place]. Records released to patient today. If she has found a local OB she will cancel her next appointment with  [facility]  Preterm labor symptoms and general obstetric precautions including but not limited to vaginal bleeding, contractions, leaking of fluid and fetal movement were reviewed in detail with the patient. Please refer to After Visit Summary for other counseling recommendations.  Return in about 4 weeks (around 07/25/2017) for LOB.   Vonzella Nipple, PA-C

## 2017-06-27 NOTE — Progress Notes (Signed)
Pt requesting a prescription for PNV.

## 2017-06-27 NOTE — Patient Instructions (Signed)

## 2017-07-23 IMAGING — US US OB COMP LESS 14 WK
1 series · 15 of 28 positions shown · non-contrast
Comparison: None.

CLINICAL DATA: Pregnant, size/dates

EXAM:
OBSTETRIC <14 WK ULTRASOUND
TECHNIQUE: Transabdominal ultrasound was performed for evaluation of the
gestation as well as the maternal uterus and adnexal regions.

[Series 1: us ob comp less 14 wk · 15 of 29 slices shown]
[im 1/29]
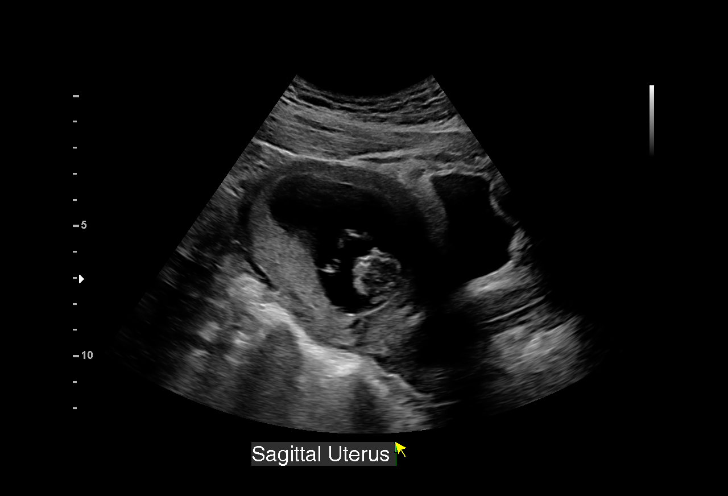
[im 3/29]
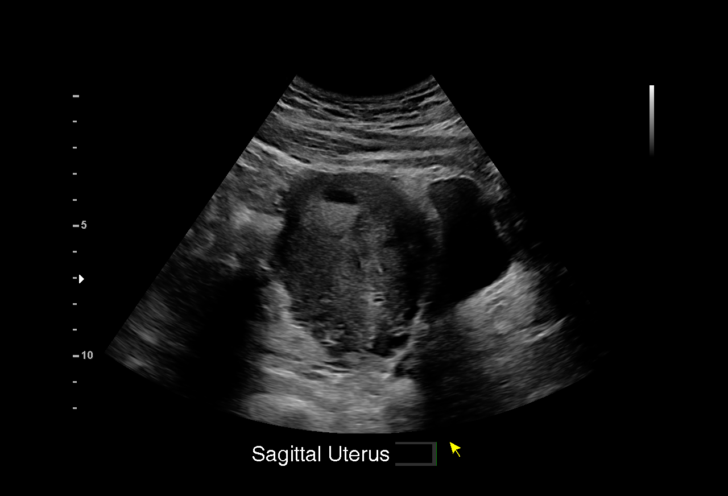
[im 5/29]
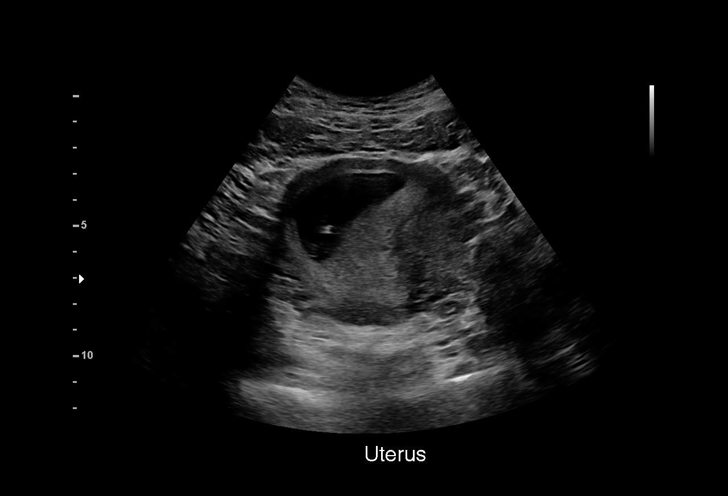
[im 7/29]
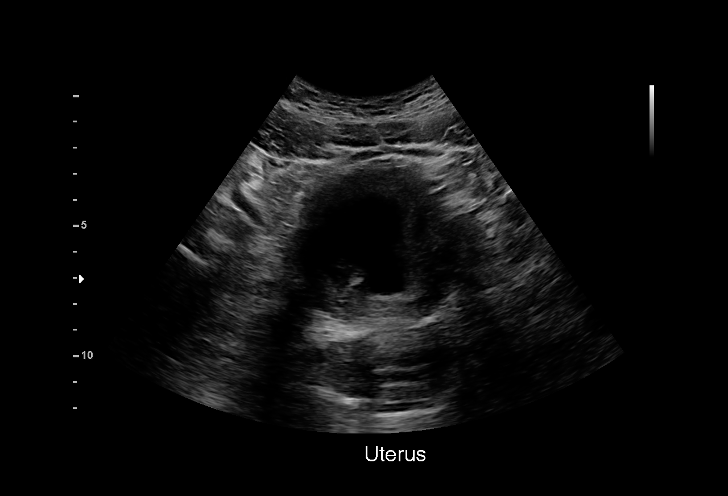
[im 9/29]
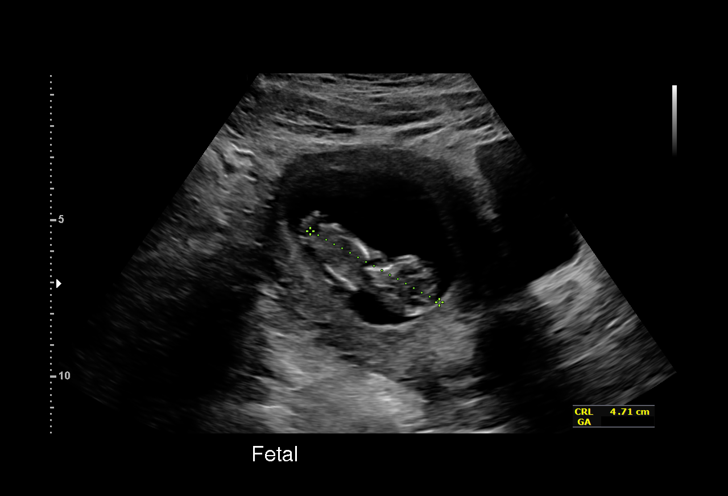
[im 11/29]
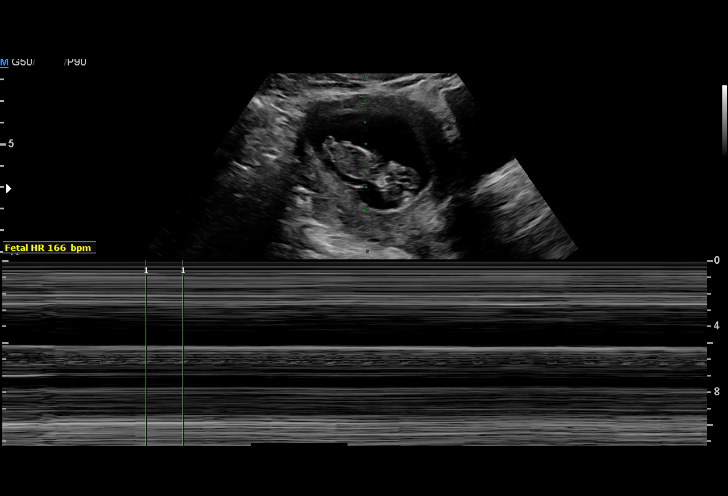
[im 13/29]
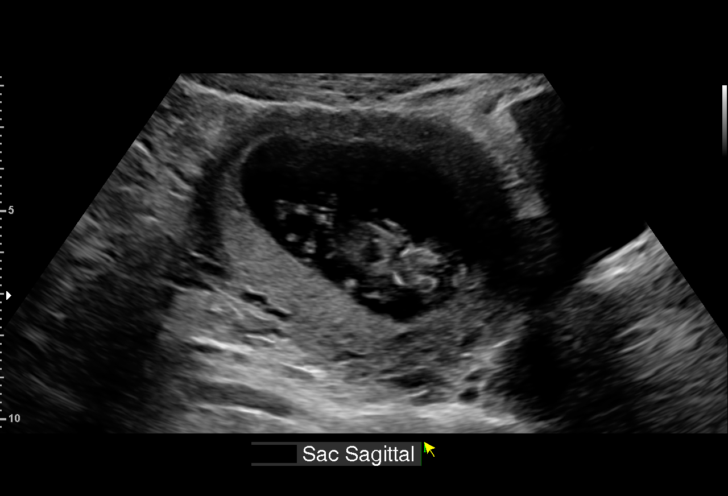
[im 15/29]
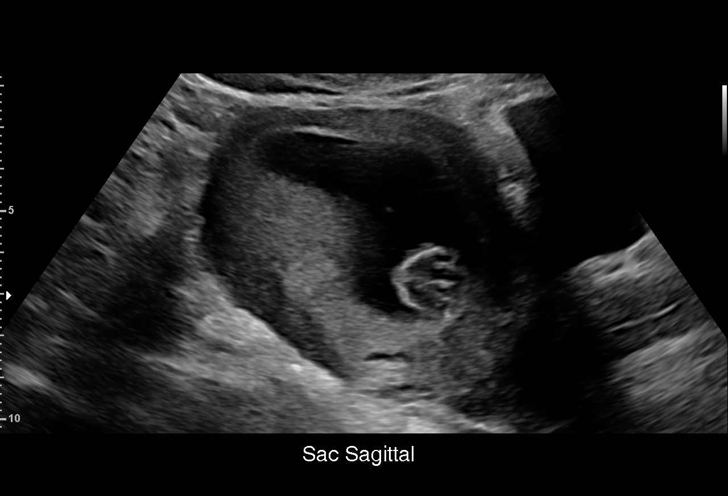
[im 16/29]
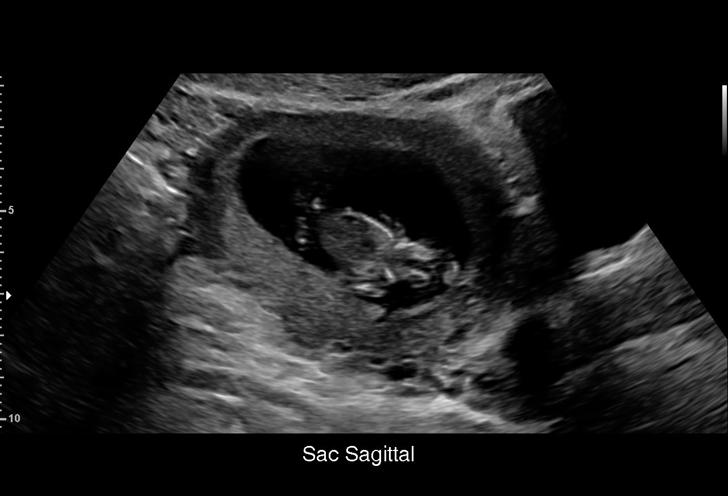
[im 18/29]
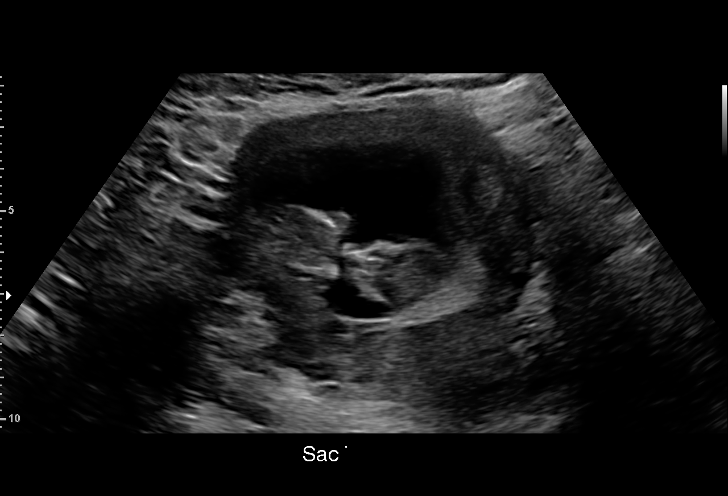
[im 20/29]
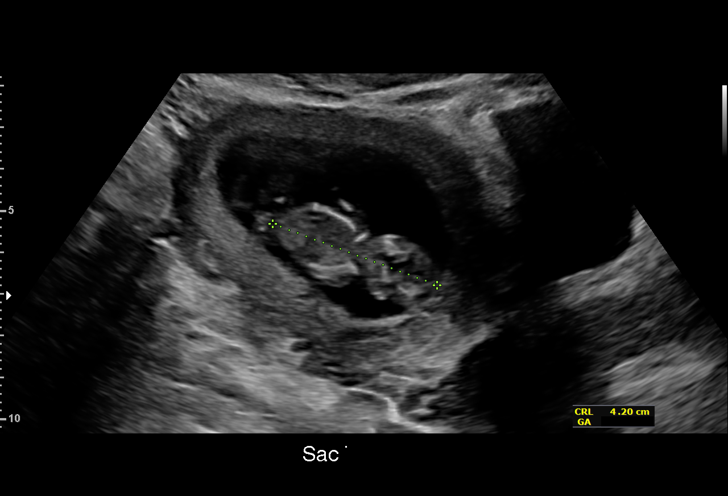
[im 22/29]
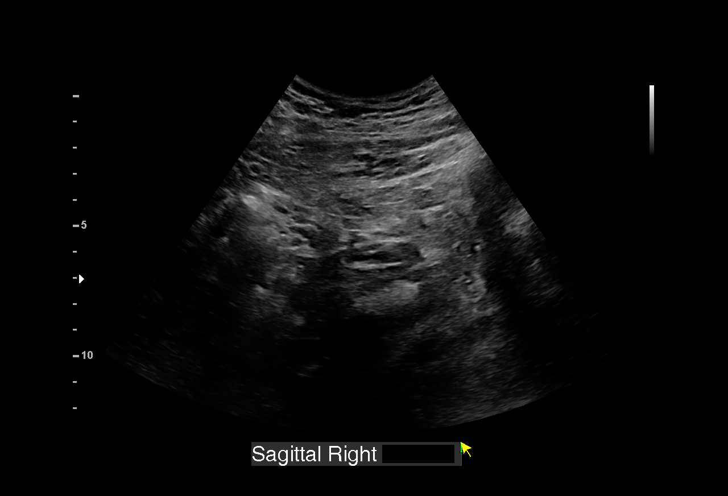
[im 24/29]
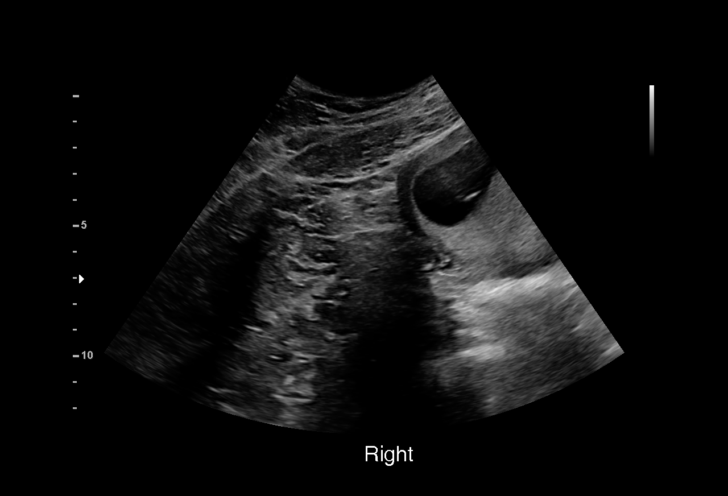
[im 26/29]
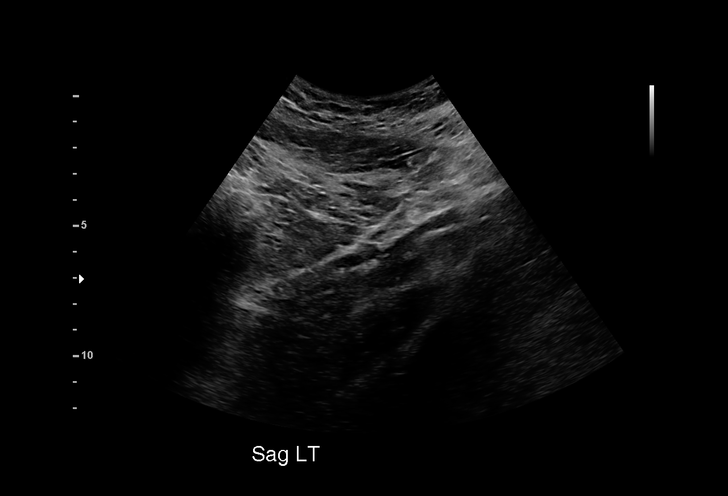
[im 29/29]
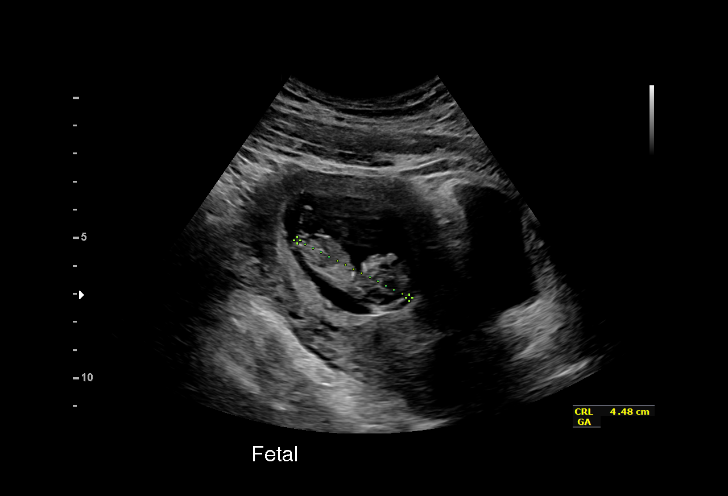

[15 of 28 positions shown; findings below may reference images not displayed]

FINDINGS: Intrauterine gestational sac: Single

Yolk sac:  Not Visualized.

Embryo:  Visualized.

Cardiac Activity: Visualized.

Heart Rate: 166 bpm

CRL:   44.9  mm   11 w 1 d                  US EDC: 10/31/2017

Subchorionic hemorrhage:  None visualized.

Maternal uterus/adnexae: Bilateral ovaries are not discretely
visualized.

No free fluid.
IMPRESSION: Single live intrauterine gestation, with estimated gestational age
11 weeks 1 day by crown-rump length.

## 2017-07-25 ENCOUNTER — Telehealth: Payer: Self-pay | Admitting: Medical

## 2017-07-25 ENCOUNTER — Encounter: Payer: Medicaid Other | Admitting: Medical

## 2017-07-25 NOTE — Telephone Encounter (Signed)
LVM that missed appt. has been reschedule for 08/04/17 :00

## 2017-08-04 ENCOUNTER — Encounter: Payer: Medicaid Other | Admitting: Medical

## 2017-09-17 IMAGING — US US MFM OB DETAIL+14 WK
1 series · 14 of 28 positions shown · non-contrast
Comparison: none

[Series 1: us mfm ob detail+14 wk · 99 acquisitions, 14 frames shown]
[im 4/99]
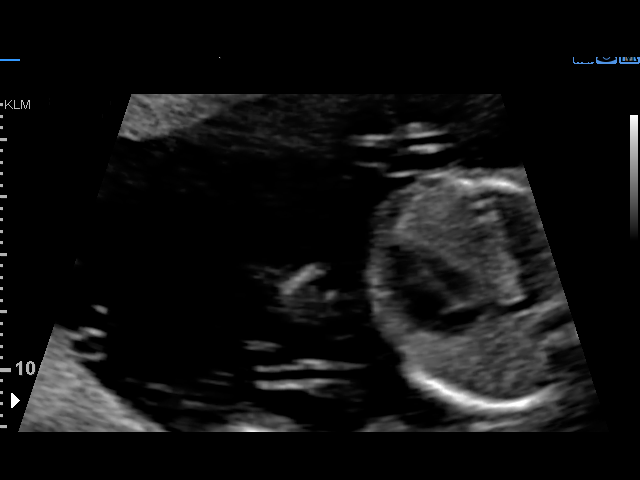
[im 11/99]
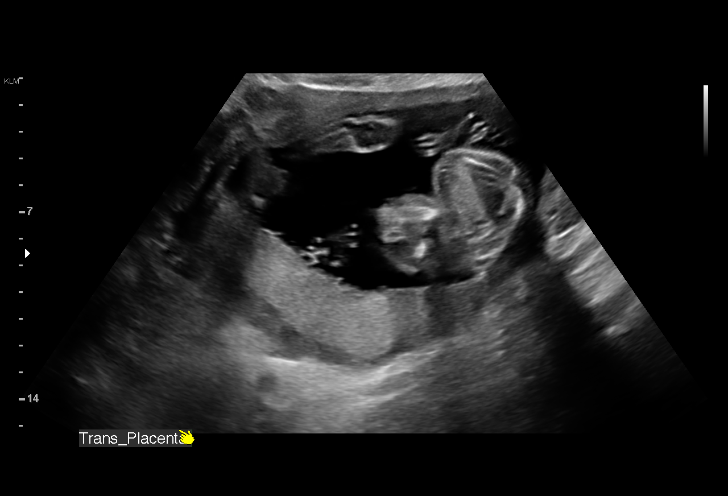
[im 19/99]
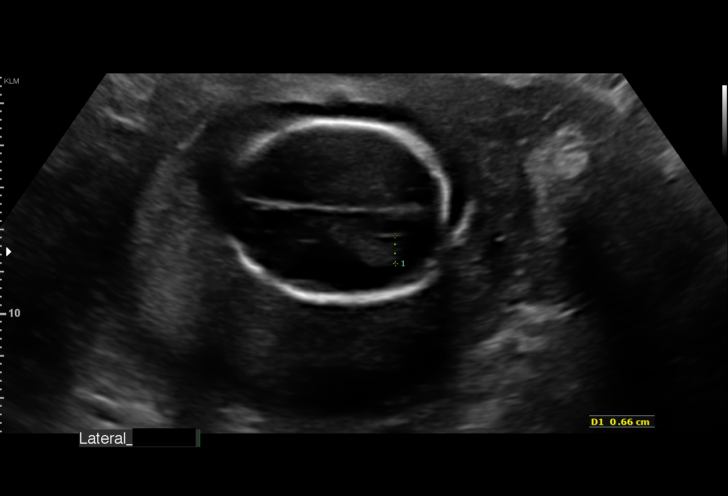
[im 26/99]
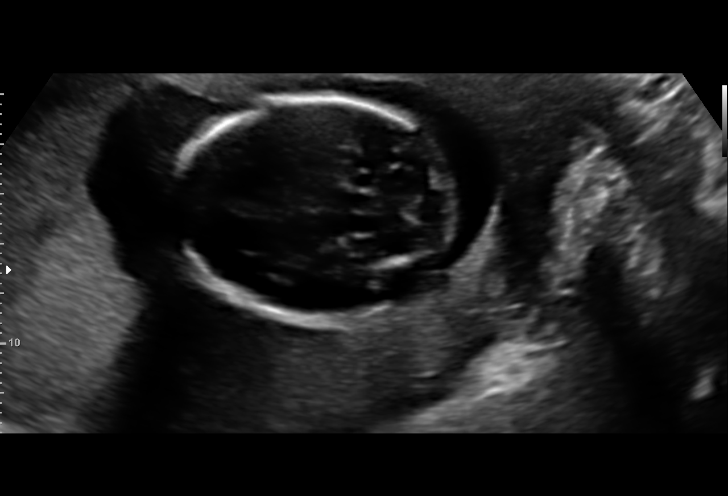
[im 33/99]
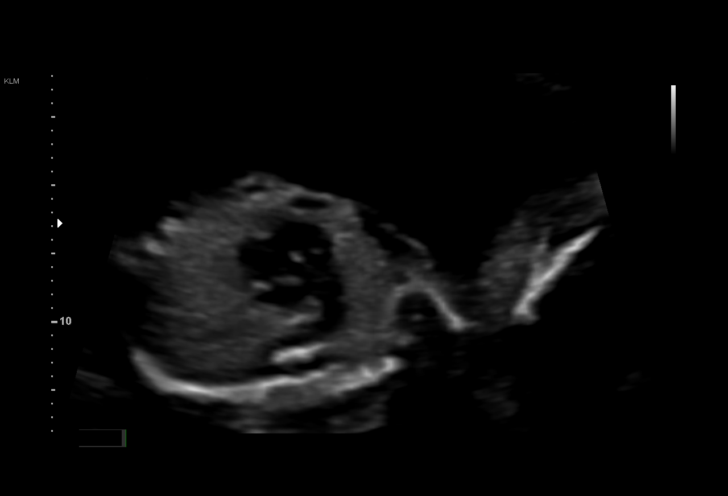
[im 40/99]
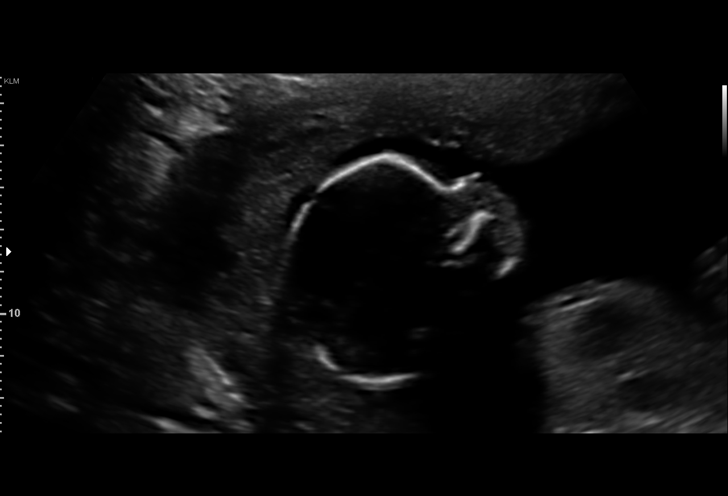
[im 48/99]
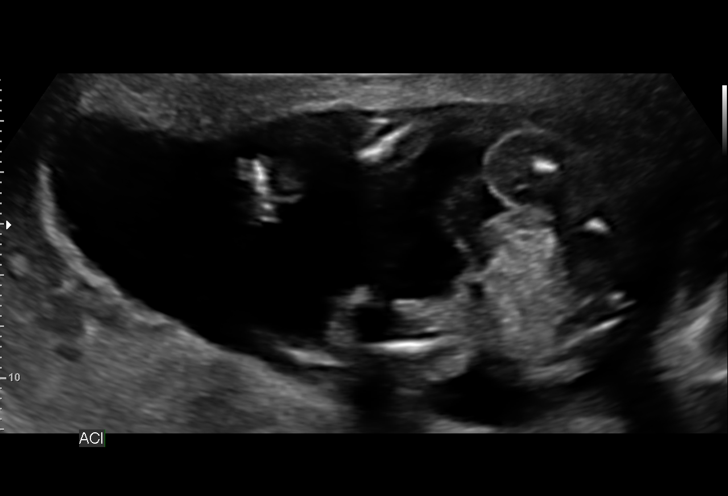
[im 55/99]
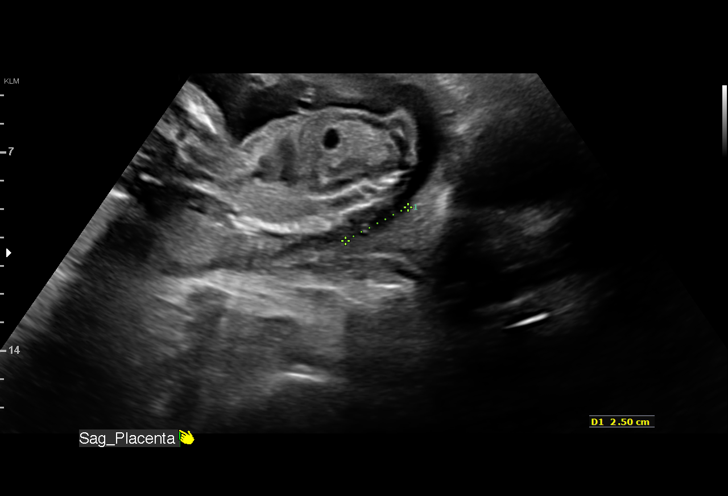
[im 62/99]
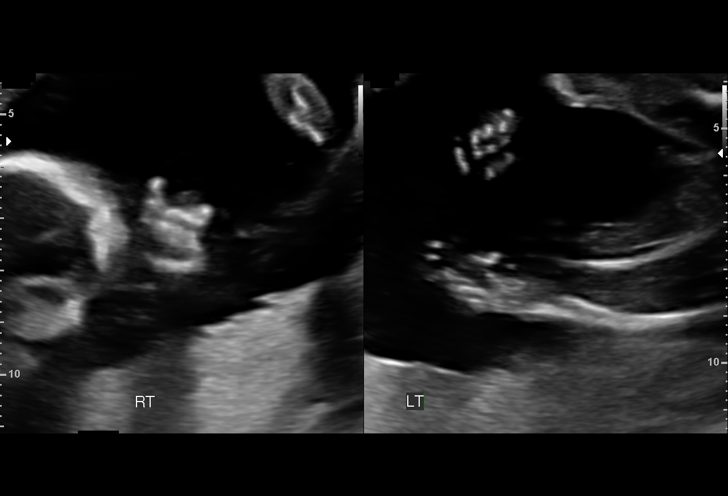
[im 69/99]
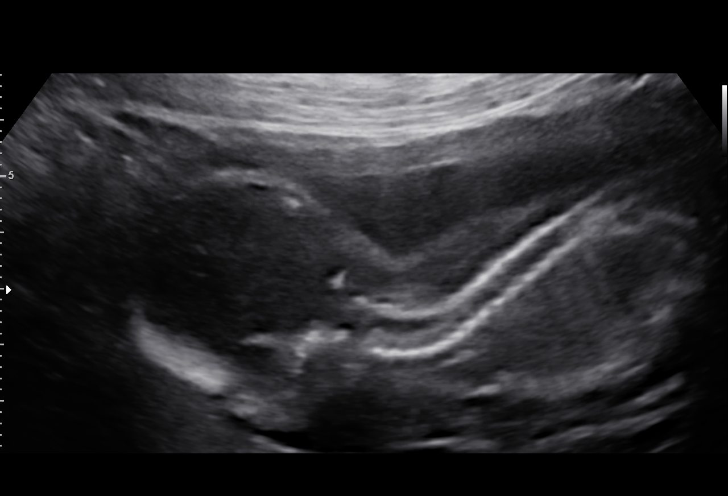
[im 77/99]
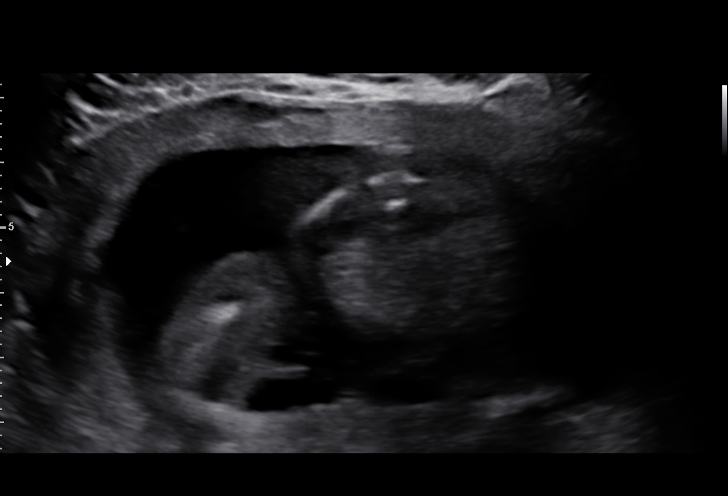
[im 84/99]
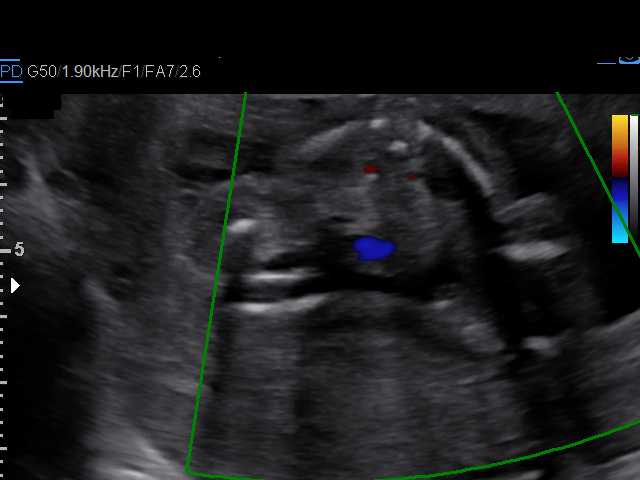
[im 91/99]
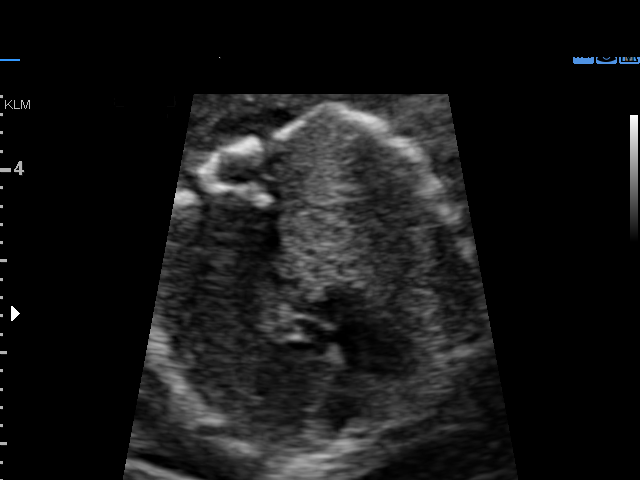
[im 99/99]
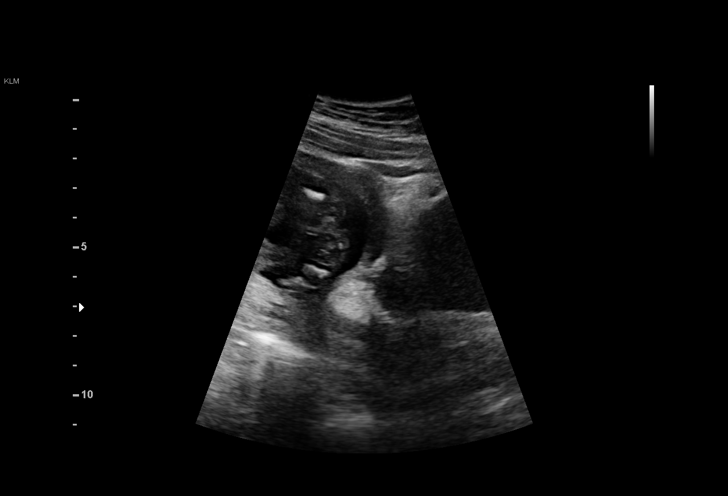

[14 of 28 positions shown; findings below may reference images not displayed]

1  G-BANI LOVVE           [id number]      [id number]     [id number]
Indications

19 weeks gestation of pregnancy
Encounter for antenatal screening for
malformations
Obesity complicating pregnancy, second
trimester (prepregnancy BMI 43.3)
OB History

Gravidity:    1         Term:   0        Prem:   0         SAB:   0
TOP:          0       Ectopic:  0        Living: 0
Fetal Evaluation

Num Of Fetuses:     1
Fetal Heart         150
Rate(bpm):
Cardiac Activity:   Observed
Presentation:       Breech
Placenta:           Posterior, above cervical os
P. Cord Insertion:  Visualized

Amniotic Fluid
AFI FV:      Subjectively within normal limits

Largest Pocket(cm)
4.3
Biometry

BPD:        44  mm     G. Age:  19w 2d         58  %    CI:         68.79  %    70 - 86
FL/HC:       18.1  %    16.1 -
HC:      169.5  mm     G. Age:  19w 4d         63  %    HC/AC:       1.22       1.09 -
AC:        139  mm     G. Age:  19w 2d         51  %    FL/BPD:      69.5  %
FL:       30.6  mm     G. Age:  19w 3d         55  %    FL/AC:       22.0  %    20 - 24
HUM:      33.3  mm     G. Age:  21w 2d       > 95  %
CER:      21.1  mm     G. Age:  20w 1d         70  %

Est. FW:     290   gm    0 lb 10 oz     50  %
Gestational Age

U/S Today:     19w 3d                                        EDD:    10/29/17
Best:          19w 1d     Det. By:  Early Ultrasound         EDD:    10/31/17
(04/12/17)
Anatomy

Cranium:               Appears normal         Aortic Arch:            Appears normal
Cavum:                 Appears normal         Ductal Arch:            Appears normal
Ventricles:            Appears normal         Diaphragm:              Appears normal
Choroid Plexus:        Appears normal         Stomach:                Appears normal, left
sided
Cerebellum:            Appears normal         Abdomen:                Appears normal
Posterior Fossa:       Appears normal         Abdominal Wall:         Appears nml (cord
insert, abd wall)
Nuchal Fold:           Appears normal         Cord Vessels:           Appears normal (3
vessel cord)
Face:                  Appears normal         Kidneys:                Appear normal
(orbits and profile)
Lips:                  Appears normal         Bladder:                Appears normal
Thoracic:              Appears normal         Spine:                  Appears normal
Heart:                 Appears normal         Upper Extremities:      Appears normal
(4CH, axis, and situs
RVOT:                  Appears normal         Lower Extremities:      Appears normal
LVOT:                  Appears normal

Other:  Fetus appears to be a female. Heels and 5th digit visualized. Nasal
bone visualized.
Cervix Uterus Adnexa

Cervix
Length:            3.3  cm.
Normal appearance by transabdominal scan.
Impression

SIUP at 19+1 weeks
Normal detailed fetal anatomy
Markers of aneuploidy: none
Normal amniotic fluid volume
Measurements consistent with early US
Recommendations

Follow-up as clinically indicated

## 2018-01-23 ENCOUNTER — Encounter (HOSPITAL_COMMUNITY): Payer: Self-pay
